# Patient Record
Sex: Male | Born: 1995 | Race: Black or African American | Hispanic: No | Marital: Single | Smoking: Never smoker
Health system: Southern US, Community
[De-identification: ages and names within clinical notes are randomized; demographics above are authoritative.]

---
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
## 2019-01-28 ENCOUNTER — Ambulatory Visit (HOSPITAL_COMMUNITY)
Admission: EM | Admit: 2019-01-28 | Discharge: 2019-01-28 | Disposition: A | Discharge status: Home / Self Care | Payer: Self-pay | Attending: Family Medicine | Admitting: Family Medicine

## 2019-01-28 ENCOUNTER — Encounter (HOSPITAL_COMMUNITY): Payer: Self-pay

## 2019-01-28 ENCOUNTER — Other Ambulatory Visit: Payer: Self-pay

## 2019-01-28 DIAGNOSIS — N342 Other urethritis: Secondary | ICD-10-CM | POA: Insufficient documentation

## 2019-01-28 LAB — POCT URINALYSIS DIP (DEVICE)
Bilirubin Urine: NEGATIVE
Glucose, UA: NEGATIVE mg/dL
Ketones, ur: NEGATIVE mg/dL
Nitrite: NEGATIVE
Protein, ur: 100 mg/dL — AB
Specific Gravity, Urine: 1.02 (ref 1.005–1.030)
Urobilinogen, UA: 0.2 mg/dL (ref 0.0–1.0)
pH: 6.5 (ref 5.0–8.0)

## 2019-01-28 MED ORDER — CEFTRIAXONE SODIUM 250 MG IJ SOLR
INTRAMUSCULAR | Status: AC
  Filled 2019-01-28: qty 250

## 2019-01-28 MED ORDER — CEFTRIAXONE SODIUM 250 MG IJ SOLR
250.0000 mg | Freq: Once | INTRAMUSCULAR | Status: AC
  Administered 2019-01-28: 17:00:00 250 mg via INTRAMUSCULAR

## 2019-01-28 MED ORDER — AZITHROMYCIN 250 MG PO TABS
ORAL_TABLET | ORAL | Status: AC
  Filled 2019-01-28: qty 4

## 2019-01-28 MED ORDER — AZITHROMYCIN 250 MG PO TABS
1000.0000 mg | ORAL_TABLET | Freq: Once | ORAL | Status: AC
  Administered 2019-01-28: 17:00:00 1000 mg via ORAL

## 2019-01-28 NOTE — ED Provider Notes (Signed)
[facility]    CSN: [id number] Arrival date & time: 01/28/19  1540     History   Chief Complaint Chief Complaint  Patient presents with  . Urinary Tract Infection    HPI Dalton Burke is a 23 y.o. male.   Dalton Burke presents with complaints of burning with urination as well as white penile discharge. Symptoms started last week. He took an OTC medication which seemed to help his symptoms but then today they worsened. No urinary frequency or urgency. No sores, lesions or redness. No abdominal pain or back pain. Hasn't felt feverish. Denies any previous similar. Has 1 sexual contact, doesn't use condoms. Without contributing medical history.     ROS per HPI, negative if not otherwise mentioned.      History reviewed. No pertinent past medical history.  There are no active problems to display for this patient.   History reviewed. No pertinent surgical history.     Home Medications    Prior to Admission medications   Not on File    Family History Family History  Family history unknown: Yes    Social History Social History   Tobacco Use  . Smoking status: Never Smoker  . Smokeless tobacco: Never Used  Substance Use Topics  . Alcohol use: Not on file  . Drug use: Not on file     Allergies   Patient has no known allergies.   Review of Systems Review of Systems   Physical Exam Triage Vital Signs ED Triage Vitals  Enc Vitals Group     BP 01/28/19 1555 (!) 157/113     Pulse Rate 01/28/19 1555 (!) 105     Resp 01/28/19 1555 17     Temp 01/28/19 1555 (!) 100.4 F (38 C)     Temp Source 01/28/19 1555 Oral     SpO2 01/28/19 1555 99 %     Weight --      Height --      Head Circumference --      Peak Flow --      Pain Score 01/28/19 1557 7     Pain Loc --      Pain Edu? --      Excl. in GC? --    No data found.  Updated Vital Signs BP (!) 157/113 (BP Location: Left Arm)   Pulse (!) 105   Temp (!) 100.4 F (38 C) (Oral)    Resp 17   SpO2 99%    Physical Exam Constitutional:      Appearance: He is well-developed.  Cardiovascular:     Rate and Rhythm: Normal rate and regular rhythm.  Pulmonary:     Effort: Pulmonary effort is normal.     Breath sounds: Normal breath sounds.  Abdominal:     Palpations: Abdomen is soft. Abdomen is not rigid.     Tenderness: There is no abdominal tenderness. There is no guarding or rebound. Negative signs include Murphy's sign and McBurney's sign.     Comments: Denies scrotal redness, swelling, pain; denies sores or lesions; gu exam deferred   Skin:    General: Skin is warm and dry.  Neurological:     Mental Status: He is alert and oriented to person, place, and time.      UC Treatments / Results  Labs (all labs ordered are listed, but only abnormal results are displayed) Labs Reviewed  POCT URINALYSIS DIP (DEVICE) - Abnormal; Notable for the following components:  Result Value   Hgb urine dipstick LARGE (*)    Protein, ur 100 (*)    Leukocytes,Ua LARGE (*)    All other components within normal limits  URINE CULTURE  URINE CYTOLOGY ANCILLARY ONLY    EKG None  Radiology No results found.  Procedures Procedures (including critical care time)  Medications Ordered in UC Medications  azithromycin (ZITHROMAX) tablet 1,000 mg (1,000 mg Oral Given 01/28/19 1645)  cefTRIAXone (ROCEPHIN) injection 250 mg (250 mg Intramuscular Given 01/28/19 1645)    Initial Impression / Assessment and Plan / UC Course  I have reviewed the triage vital signs and the nursing notes.  Pertinent labs & imaging results that were available during my care of the patient were reviewed by me and considered in my medical decision making (see chart for details).     Will treat for urethritis, suspected STD. Urine cytology pending. Empiric azithromycin and ceftriaxone provided. Urine culture as well due to leuks and hgb in UA. Return precautions provided. Encouraged safe sex  practices. Patient verbalized understanding and agreeable to plan.  Final Clinical Impressions(s) / UC Diagnoses   Final diagnoses:  Urethritis     Discharge Instructions     We will treat you for gonorrhea and chlamydia as either of those are most likely source of your symptoms.  Will notify you of any positive findings and if any changes to treatment are needed.  You may monitor your results on your MyChart online as well.   Please withhold from intercourse for the next week. Please use condoms to prevent STD's.   If symptoms worsen or do not improve in the next week to return to be seen or to follow up with your pcp.    ED Prescriptions    None     Controlled Substance Prescriptions [place] Controlled Substance Registry consulted? Not Applicable   Georgetta Haber, NP 01/28/19 7591

## 2019-01-28 NOTE — ED Triage Notes (Signed)
Pt presents with urinary tract symptoms for past few days; burning with urination with no other complaints or symptoms.

## 2019-01-28 NOTE — Discharge Instructions (Signed)
We will treat you for gonorrhea and chlamydia as either of those are most likely source of your symptoms.  Will notify you of any positive findings and if any changes to treatment are needed.  You may monitor your results on your MyChart online as well.   Please withhold from intercourse for the next week. Please use condoms to prevent STD's.   If symptoms worsen or do not improve in the next week to return to be seen or to follow up with your pcp.

## 2019-01-29 LAB — URINE CULTURE: Culture: NO GROWTH

## 2019-01-31 LAB — URINE CYTOLOGY ANCILLARY ONLY
Chlamydia: NEGATIVE
Neisseria Gonorrhea: POSITIVE — AB
Trichomonas: NEGATIVE

## 2019-02-07 ENCOUNTER — Telehealth (HOSPITAL_COMMUNITY): Payer: Self-pay | Admitting: Emergency Medicine

## 2019-02-07 NOTE — Telephone Encounter (Signed)
Test for gonorrhea was positive. This was treated at the urgent care visit with IM rocephin 250mg  and po zithromax 1g. Pt needs education to refrain from sexual intercourse for 7 days after treatment to give the medicine time to work. Sexual partners need to be notified and tested/treated. Condoms may reduce risk of reinfection. Recheck or followup with PCP for further evaluation if symptoms are not improving. [facility] notified.   Attempted to reach patient. No answer at this time. Only number on file not in service. Letter sent to patient.

## 2023-06-11 ENCOUNTER — Encounter: Payer: Self-pay | Admitting: Nurse Practitioner

## 2023-06-11 ENCOUNTER — Ambulatory Visit: Payer: 59 | Admitting: Nurse Practitioner

## 2023-06-11 VITALS — BP 120/78 | HR 86 | Temp 98.1°F | Ht 72.0 in | Wt 156.2 lb

## 2023-06-11 DIAGNOSIS — Z0000 Encounter for general adult medical examination without abnormal findings: Secondary | ICD-10-CM

## 2023-06-11 DIAGNOSIS — Z114 Encounter for screening for human immunodeficiency virus [HIV]: Secondary | ICD-10-CM

## 2023-06-11 DIAGNOSIS — Z1329 Encounter for screening for other suspected endocrine disorder: Secondary | ICD-10-CM

## 2023-06-11 DIAGNOSIS — Z1159 Encounter for screening for other viral diseases: Secondary | ICD-10-CM

## 2023-06-11 DIAGNOSIS — Z23 Encounter for immunization: Secondary | ICD-10-CM | POA: Diagnosis not present

## 2023-06-11 DIAGNOSIS — Z13220 Encounter for screening for lipoid disorders: Secondary | ICD-10-CM

## 2023-06-11 LAB — COMPREHENSIVE METABOLIC PANEL
ALT: 14 U/L (ref 0–53)
AST: 14 U/L (ref 0–37)
Albumin: 4.8 g/dL (ref 3.5–5.2)
Alkaline Phosphatase: 49 U/L (ref 39–117)
BUN: 9 mg/dL (ref 6–23)
CO2: 27 meq/L (ref 19–32)
Calcium: 9.9 mg/dL (ref 8.4–10.5)
Chloride: 104 meq/L (ref 96–112)
Creatinine, Ser: 0.98 mg/dL (ref 0.40–1.50)
GFR: 105.3 mL/min (ref >60.00)
Glucose, Bld: 91 mg/dL (ref 70–99)
Potassium: 3.9 meq/L (ref 3.5–5.1)
Sodium: 140 meq/L (ref 135–145)
Total Bilirubin: 1.2 mg/dL (ref 0.2–1.2)
Total Protein: 6.9 g/dL (ref 6.0–8.3)

## 2023-06-11 LAB — CBC WITH DIFFERENTIAL/PLATELET
Basophils Absolute: 0 10*3/uL (ref 0.0–0.1)
Basophils Relative: 1 % (ref 0.0–3.0)
Eosinophils Absolute: 0.1 10*3/uL (ref 0.0–0.7)
Eosinophils Relative: 2.3 % (ref 0.0–5.0)
HCT: 48.2 % (ref 39.0–52.0)
Hemoglobin: 15.4 g/dL (ref 13.0–17.0)
Lymphocytes Relative: 26.3 % (ref 12.0–46.0)
Lymphs Abs: 1.2 10*3/uL (ref 0.7–4.0)
MCHC: 31.9 g/dL (ref 30.0–36.0)
MCV: 86.1 fL (ref 78.0–100.0)
Monocytes Absolute: 0.4 10*3/uL (ref 0.1–1.0)
Monocytes Relative: 10.1 % (ref 3.0–12.0)
Neutro Abs: 2.7 10*3/uL (ref 1.4–7.7)
Neutrophils Relative %: 60.3 % (ref 43.0–77.0)
Platelets: 228 10*3/uL (ref 150.0–400.0)
RBC: 5.6 Mil/uL (ref 4.22–5.81)
RDW: 13 % (ref 11.5–15.5)
WBC: 4.4 10*3/uL (ref 4.0–10.5)

## 2023-06-11 LAB — LIPID PANEL
Cholesterol: 126 mg/dL (ref 0–200)
HDL: 50.2 mg/dL (ref >39.00)
LDL Cholesterol: 67 mg/dL (ref 0–99)
NonHDL: 75.8
Total CHOL/HDL Ratio: 3
Triglycerides: 43 mg/dL (ref 0.0–149.0)
VLDL: 8.6 mg/dL (ref 0.0–40.0)

## 2023-06-11 LAB — TSH: TSH: 0.94 u[IU]/mL (ref 0.35–5.50)

## 2023-06-11 NOTE — Progress Notes (Signed)
Bethanie Dicker, NP-C Phone: [phone]  Dalton Burke is a 27 y.o. male who presents today to establish care and for annual exam. He has no complaints or new concerns today. He has no significant past medical history. He is not on any medications.   Diet: Good- no fast food, no red meat or pork, increased protein Exercise: Cardio 2 days per week, strength training 2 days per week Family history-  Prostate cancer: No  Colon cancer: No Sexually active: Yes Vaccines-   Flu: Today  Tetanus: 04/20/2013- Due!  COVID19: x 3 HIV screening: Today Hep C Screening: Today Tobacco use: No Alcohol use: Yes, special occasions only Illicit Drug use: No Dentist: No Ophthalmology: No  Active Ambulatory Problems    Diagnosis Date Noted   Preventative health care 06/11/2023   Resolved Ambulatory Problems    Diagnosis Date Noted   No Resolved Ambulatory Problems   No Additional Past Medical History    Family History  Family history unknown: Yes    Social History   Socioeconomic History   Marital status: Single    Spouse name: Not on file   Number of children: Not on file   Years of education: Not on file   Highest education level: Not on file  Occupational History   Not on file  Tobacco Use   Smoking status: Never   Smokeless tobacco: Never  Substance and Sexual Activity   Alcohol use: Yes   Drug use: Never   Sexual activity: Yes  Other Topics Concern   Not on file  Social History Narrative   Not on file   Social Determinants of Health   Financial Resource Strain: Not on file  Food Insecurity: Not on file  Transportation Needs: Not on file  Physical Activity: Not on file  Stress: Not on file  Social Connections: Not on file  Intimate Partner Violence: Not on file    ROS  General:  Negative for unexplained weight loss, fever Skin: Negative for new or changing mole, sore that won't heal HEENT: Negative for trouble hearing, trouble seeing, ringing in ears, mouth  sores, hoarseness, change in voice, dysphagia. CV:  Negative for chest pain, dyspnea, edema, palpitations Resp: Negative for cough, dyspnea, hemoptysis GI: Negative for nausea, vomiting, diarrhea, constipation, abdominal pain, melena, hematochezia. GU: Negative for dysuria, incontinence, urinary hesitance, hematuria, vaginal or penile discharge, polyuria, sexual difficulty, lumps in testicle or breasts MSK: Negative for muscle cramps or aches, joint pain or swelling Neuro: Negative for headaches, weakness, numbness, dizziness, passing out/fainting Psych: Negative for depression, anxiety, memory problems  Objective  Physical Exam Vitals:   06/11/23 0902  BP: 120/78  Pulse: 86  Temp: 98.1 F (36.7 C)  SpO2: 100%    BP Readings from Last 3 Encounters:  06/11/23 120/78  01/28/19 (!) 157/113   Wt Readings from Last 3 Encounters:  06/11/23 156 lb 3.2 oz (70.9 kg)    Physical Exam Constitutional:      General: He is not in acute distress.    Appearance: Normal appearance.  HENT:     Head: Normocephalic.     Right Ear: Tympanic membrane normal.     Left Ear: Tympanic membrane normal.     Nose: Nose normal.     Mouth/Throat:     Mouth: Mucous membranes are moist.     Pharynx: Oropharynx is clear.  Eyes:     Conjunctiva/sclera: Conjunctivae normal.     Pupils: Pupils are equal, round, and reactive to light.  Neck:     Thyroid: No thyromegaly.  Cardiovascular:     Rate and Rhythm: Normal rate and regular rhythm.     Heart sounds: Normal heart sounds.  Pulmonary:     Effort: Pulmonary effort is normal.     Breath sounds: Normal breath sounds.  Abdominal:     General: Abdomen is flat. Bowel sounds are normal.     Palpations: Abdomen is soft. There is no mass.     Tenderness: There is no abdominal tenderness.  Musculoskeletal:        General: Normal range of motion.  Lymphadenopathy:     Cervical: No cervical adenopathy.  Skin:    General: Skin is warm and dry.      Findings: No rash.  Neurological:     General: No focal deficit present.     Mental Status: He is alert.  Psychiatric:        Mood and Affect: Mood normal.        Behavior: Behavior normal.    Assessment/Plan:   Preventative health care Assessment & Plan: Physical exam complete. Lab work as outlined, he will return to complete these. Will contact patient with results. Flu vaccine- given today in office. Tetanus vaccine- due. He will return for this or get at local pharmacy or health department. Declined additional COVID vaccines. HIV and Hep C screenings ordered in lab work. Recommended establishing with Dentist and Ophthalmology for annual exams. Encouraged to continue healthy diet and exercise. Return to care in one year, sooner PRN.   Orders: -     CBC with Differential/Platelet; Future -     Comprehensive metabolic panel; Future  Need for influenza vaccination -     Flu vaccine trivalent PF, 6mos and older(Flulaval,Afluria,Fluarix,Fluzone)  Thyroid disorder screen -     TSH; Future  Lipid screening -     Lipid panel; Future  Encounter for screening for HIV -     HIV Antibody (routine testing w rflx); Future  Encounter for hepatitis C screening test for low risk patient -     Hepatitis C antibody; Future    Return in about 1 year (around 06/10/2024) for Annual Exam, sooner PRN.   Bethanie Dicker, NP-C [facility] [employer]

## 2023-06-11 NOTE — Assessment & Plan Note (Addendum)
Physical exam complete. Lab work as outlined, he will return to complete these. Will contact patient with results. Flu vaccine- given today in office. Tetanus vaccine- due. He will return for this or get at local pharmacy or health department. Declined additional COVID vaccines. HIV and Hep C screenings ordered in lab work. Recommended establishing with Dentist and Ophthalmology for annual exams. Encouraged to continue healthy diet and exercise. Return to care in one year, sooner PRN.

## 2023-06-12 LAB — HEPATITIS C ANTIBODY: Hepatitis C Ab: NONREACTIVE

## 2023-06-12 LAB — HIV ANTIBODY (ROUTINE TESTING W REFLEX): HIV 1&2 Ab, 4th Generation: NONREACTIVE

## 2023-06-16 ENCOUNTER — Telehealth: Payer: Self-pay | Admitting: Nurse Practitioner

## 2023-06-16 NOTE — Telephone Encounter (Signed)
Left detailed Vm asking pt what other proof was needed as I provided him our immunization record for him at his appt and he was informed that he would need to upload it to a website per my RN supervisor

## 2023-06-16 NOTE — Telephone Encounter (Signed)
Patient just called and said he took his flu shot here last week. He works at [employer] and they said he needs proof for the flu shot. His number [phone]. He will pick it up when ready.

## 2023-06-22 ENCOUNTER — Ambulatory Visit: Payer: 59

## 2023-08-11 ENCOUNTER — Telehealth: Payer: Self-pay

## 2023-08-11 ENCOUNTER — Ambulatory Visit (INDEPENDENT_AMBULATORY_CARE_PROVIDER_SITE_OTHER): Payer: 59

## 2023-08-11 DIAGNOSIS — Z111 Encounter for screening for respiratory tuberculosis: Secondary | ICD-10-CM

## 2023-08-11 NOTE — Progress Notes (Signed)
PPD Placement note Dalton Burke, 27 y.o. male is here today for placement of PPD test Reason for PPD test: Work  Pt taken PPD test before: no Verified in allergy area and with patient that they are not allergic to the products PPD is made of (Phenol or Tween). No: NKA Is patient taking any oral or IV steroid medication now or have they taken it in the last month? no Has the patient ever received the BCG vaccine?: no Has the patient been in recent contact with anyone known or suspected of having active TB disease?: no      Date of exposure (if applicable): N/A      Name of person they were exposed to (if applicable): N/A Patient's Country of origin?: [place] O: Alert and oriented in NAD. P:  PPD placed on 08/11/2023.  Patient advised to return for reading within 48-72 hours.   Placed in the right lower forearm.

## 2023-08-11 NOTE — Telephone Encounter (Signed)
Pt presented for [place] for PPD placement. Pt stated he needed documentation for work. I informed him that when he comes into have it read we can provide a letter and copy of immunization record for him.   Copy of the nurse visit note as well as immunization record has been printed and placed in provider result folder. Pt will be scheduled on Friday to have read. Once pt comes in for reading a letter will be written documenting when he received the PPD and the result of the PPD reading.

## 2023-08-13 ENCOUNTER — Ambulatory Visit: Payer: 59 | Admitting: Nurse Practitioner

## 2023-08-14 ENCOUNTER — Ambulatory Visit: Payer: 59

## 2023-08-14 DIAGNOSIS — Z111 Encounter for screening for respiratory tuberculosis: Secondary | ICD-10-CM

## 2023-08-14 NOTE — Progress Notes (Signed)
PPD Reading Note  PPD read and results entered in Epic[employer].  Result: 0 mm induration.  Interpretation: Negative  If test not read within 48-72 hours of initial placement, patient advised to repeat in other arm 1-3 weeks after this test.  Allergic reaction: no

## 2024-04-14 ENCOUNTER — Ambulatory Visit: Admitting: Nurse Practitioner

## 2024-04-14 ENCOUNTER — Telehealth: Payer: Self-pay | Admitting: Nurse Practitioner

## 2024-04-14 NOTE — Telephone Encounter (Unsigned)
 Copied from CRM [id number]. Topic: General - Other >> Apr 14, 2024  1:29 PM Roselie BROCKS wrote: Reason for CRM: Pts appointment was cancelled by clinic and wants to speak to the someone about why it was cancelled and needs  a quicker appnt then available.

## 2024-04-14 NOTE — Telephone Encounter (Signed)
 Left message that provider had an emergency and about had to be canceled. Patient needs to reschedule appt.

## 2024-04-15 NOTE — Telephone Encounter (Signed)
 Scheduled with PCP 04/21/24 at 11 am.

## 2024-04-21 ENCOUNTER — Encounter: Payer: Self-pay | Admitting: Nurse Practitioner

## 2024-04-21 ENCOUNTER — Encounter: Admitting: Nurse Practitioner

## 2024-04-21 ENCOUNTER — Ambulatory Visit (INDEPENDENT_AMBULATORY_CARE_PROVIDER_SITE_OTHER)

## 2024-04-21 DIAGNOSIS — Z23 Encounter for immunization: Secondary | ICD-10-CM | POA: Diagnosis not present

## 2024-04-21 NOTE — Progress Notes (Signed)
 Patient is in office today for a nurse visit for Immunization TDAP. Patient Injection was given in the  Left deltoid. Patient tolerated injection well.

## 2024-04-21 NOTE — Progress Notes (Signed)
 This encounter was created in error - please disregard.

## 2024-06-23 DIAGNOSIS — H52223 Regular astigmatism, bilateral: Secondary | ICD-10-CM | POA: Diagnosis not present

## 2024-06-23 DIAGNOSIS — Z135 Encounter for screening for eye and ear disorders: Secondary | ICD-10-CM | POA: Diagnosis not present

## 2024-06-27 ENCOUNTER — Telehealth: Payer: Self-pay | Admitting: Nurse Practitioner

## 2024-06-27 NOTE — Telephone Encounter (Signed)
 Lm that patient's provider is not in the office on 07/11/24, at time of phone call there were no physicals available with another provider at office, unable to fit patient in on 07/11/2024.       Copied from CRM [id number]. Topic: Appointments - Scheduling Inquiry for Clinic >> Jun 24, 2024  5:01 PM Kevelyn M wrote: Patient needs a physical and tb shot before November 10th. Patient is requesting to see if provider can squeeze him.  Call back: [phone]
